# Patient Record
Sex: Female | Born: 1975 | Race: White | Hispanic: No | Marital: Married | Smoking: Never smoker
Health system: Southern US, Community
[De-identification: ages and names within clinical notes are randomized; demographics above are authoritative.]

## PROBLEM LIST (undated history)

## (undated) DIAGNOSIS — J45909 Unspecified asthma, uncomplicated: Secondary | ICD-10-CM

## (undated) HISTORY — PX: CHOLECYSTECTOMY: SHX55

## (undated) HISTORY — PX: TUBAL LIGATION: SHX77

## (undated) HISTORY — PX: ABDOMINAL HYSTERECTOMY: SHX81

---

## 2013-06-17 ENCOUNTER — Emergency Department (HOSPITAL_BASED_OUTPATIENT_CLINIC_OR_DEPARTMENT_OTHER): Payer: BC Managed Care – PPO

## 2013-06-17 ENCOUNTER — Emergency Department (HOSPITAL_BASED_OUTPATIENT_CLINIC_OR_DEPARTMENT_OTHER)
Admission: EM | Admit: 2013-06-17 | Discharge: 2013-06-17 | Disposition: A | Discharge status: Home / Self Care | Payer: BC Managed Care – PPO | Attending: Emergency Medicine | Admitting: Emergency Medicine

## 2013-06-17 ENCOUNTER — Encounter (HOSPITAL_BASED_OUTPATIENT_CLINIC_OR_DEPARTMENT_OTHER): Payer: Self-pay | Admitting: Emergency Medicine

## 2013-06-17 DIAGNOSIS — S93409A Sprain of unspecified ligament of unspecified ankle, initial encounter: Secondary | ICD-10-CM | POA: Diagnosis not present

## 2013-06-17 DIAGNOSIS — Y92009 Unspecified place in unspecified non-institutional (private) residence as the place of occurrence of the external cause: Secondary | ICD-10-CM | POA: Diagnosis not present

## 2013-06-17 DIAGNOSIS — Y9389 Activity, other specified: Secondary | ICD-10-CM | POA: Diagnosis not present

## 2013-06-17 DIAGNOSIS — W010XXA Fall on same level from slipping, tripping and stumbling without subsequent striking against object, initial encounter: Secondary | ICD-10-CM | POA: Insufficient documentation

## 2013-06-17 DIAGNOSIS — J45909 Unspecified asthma, uncomplicated: Secondary | ICD-10-CM | POA: Insufficient documentation

## 2013-06-17 DIAGNOSIS — Z79899 Other long term (current) drug therapy: Secondary | ICD-10-CM | POA: Diagnosis not present

## 2013-06-17 DIAGNOSIS — M25579 Pain in unspecified ankle and joints of unspecified foot: Secondary | ICD-10-CM | POA: Diagnosis present

## 2013-06-17 HISTORY — DX: Unspecified asthma, uncomplicated: J45.909

## 2013-06-17 MED ORDER — HYDROCODONE-ACETAMINOPHEN 5-325 MG PO TABS
1.0000 | ORAL_TABLET | Freq: Once | ORAL | Status: AC
  Administered 2013-06-17: 1 via ORAL
  Filled 2013-06-17: qty 1

## 2013-06-17 MED ORDER — HYDROCODONE-ACETAMINOPHEN 5-325 MG PO TABS: 2.0000 | ORAL_TABLET | ORAL | Status: DC | PRN

## 2013-06-17 NOTE — ED Provider Notes (Signed)
CSN: [id number]     Arrival date & time 06/17/13  1041 History   First MD Initiated Contact with Patient 06/17/13 1148     Chief Complaint  Patient presents with  . Ankle Pain      HPI  Patient inverted her left ankle walking in flip-flops yesterday in her yard. Complains of pain in the left lateral ankle. Has a limp. No pain in the foot. No pain in the proximal lower leg.  Past Medical History  Diagnosis Date  . Asthma    Past Surgical History  Procedure Laterality Date  . Cholecystectomy    . Tubal ligation     No family history on file. History  Substance Use Topics  . Smoking status: Never Smoker   . Smokeless tobacco: Never Used  . Alcohol Use: Yes     Comment: occassionally   OB History   Grav Para Term Preterm Abortions TAB SAB Ect Mult Living                 Review of Systems  Musculoskeletal: Positive for arthralgias, gait problem and joint swelling.      Allergies  Review of patient's allergies indicates no known allergies.  Home Medications   Prior to Admission medications   Medication Sig Start Date End Date Taking? Authorizing Provider  albuterol (PROVENTIL) (2.5 MG/3ML) 0.083% nebulizer solution Take 2.5 mg by nebulization every 6 (six) hours as needed for wheezing or shortness of breath.   Yes Historical Provider, MD  montelukast (SINGULAIR) 10 MG tablet Take 10 mg by mouth as needed.   Yes Historical Provider, MD   BP 131/80  Pulse 86  Temp(Src) 98.6 F (37 C) (Oral)  Resp 18  Ht 5' (1.524 m)  Wt 235 lb (106.595 kg)  BMI 45.90 kg/m2  SpO2 99%  LMP 06/05/2013 Physical Exam  Musculoskeletal:       Feet:  Pain, soft tissue swelling, and tenderness anterior inferior left lateral malleolus. No pain along the proximal fibula. Nontender in the head of the fifth metatarsal or to the foot. Capillary refill. Normal sensation.    ED Course  Procedures (including critical care time) Labs Review Labs Reviewed - No data to display  Imaging  Review Dg Ankle Complete Left  06/17/2013   CLINICAL DATA:  Left ankle pain following injury.  EXAM: LEFT ANKLE COMPLETE - 3+ VIEW  COMPARISON:  None.  FINDINGS: No fracture. Ankle mortise is normally spaced and aligned. There is significant arthropathic change. There are tiny plantar and dorsal calcaneal spurs. Mild diffuse soft tissue swelling is noted.  IMPRESSION: No fracture or dislocation.   Electronically Signed   By: Amie Portland M.D.   On: 06/17/2013 11:39     EKG Interpretation None      MDM   Final diagnoses:  Ankle sprain   X-ray showed a fracture. Placed in an Aircast splint. Instructed to use crutches. Given Vicodin for pain here. Prescription for #10 Vicodin at home. Nonlabored 72 hours. Slowly increase weightbearing as tolerated. Ice elevation.    Rolland Porter, MD 06/17/13 1218

## 2013-06-17 NOTE — Discharge Instructions (Signed)

## 2013-06-17 NOTE — ED Notes (Signed)
Patient states she was getting out of her pool and tripped and fell.  States she has pain in her left lateral ankle.

## 2014-06-18 ENCOUNTER — Encounter (HOSPITAL_BASED_OUTPATIENT_CLINIC_OR_DEPARTMENT_OTHER): Payer: Self-pay

## 2014-06-18 ENCOUNTER — Emergency Department (HOSPITAL_BASED_OUTPATIENT_CLINIC_OR_DEPARTMENT_OTHER)
Admission: EM | Admit: 2014-06-18 | Discharge: 2014-06-19 | Disposition: A | Discharge status: Home / Self Care | Payer: BLUE CROSS/BLUE SHIELD | Attending: Emergency Medicine | Admitting: Emergency Medicine

## 2014-06-18 DIAGNOSIS — Z79899 Other long term (current) drug therapy: Secondary | ICD-10-CM | POA: Diagnosis not present

## 2014-06-18 DIAGNOSIS — J45909 Unspecified asthma, uncomplicated: Secondary | ICD-10-CM | POA: Diagnosis not present

## 2014-06-18 DIAGNOSIS — N764 Abscess of vulva: Secondary | ICD-10-CM | POA: Insufficient documentation

## 2014-06-18 DIAGNOSIS — L02214 Cutaneous abscess of groin: Secondary | ICD-10-CM | POA: Diagnosis present

## 2014-06-18 DIAGNOSIS — L0291 Cutaneous abscess, unspecified: Secondary | ICD-10-CM

## 2014-06-18 DIAGNOSIS — Z9851 Tubal ligation status: Secondary | ICD-10-CM | POA: Diagnosis not present

## 2014-06-18 MED ORDER — LIDOCAINE-EPINEPHRINE 2 %-1:100000 IJ SOLN
20.0000 mL | Freq: Once | INTRAMUSCULAR | Status: DC
  Filled 2014-06-18: qty 1

## 2014-06-18 MED ORDER — SULFAMETHOXAZOLE-TRIMETHOPRIM 800-160 MG PO TABS: 1.0000 | ORAL_TABLET | Freq: Two times a day (BID) | ORAL | Status: DC

## 2014-06-18 NOTE — ED Notes (Signed)
C/o vaginal abscess x 3 days

## 2014-06-18 NOTE — Discharge Instructions (Signed)

## 2014-06-18 NOTE — ED Provider Notes (Signed)
CSN: [id number]     Arrival date & time 06/18/14  2238 History  This chart was scribed for Mirian MoMatthew Helmi Hechavarria, MD by Richarda Overlieichard Holland, ED Scribe. This patient was seen in room MH03/MH03 and the patient's care was started 11:35 PM.   Chief Complaint  Patient presents with  . Abscess   Patient is a 39 y.o. female presenting with abscess. The history is provided by the patient. No language interpreter was used.  Abscess Location:  Ano-genital Ano-genital abscess location:  Groin Abscess quality: draining   Duration:  3 days Progression:  Unchanged Chronicity:  New Associated symptoms: no fever, no nausea and no vomiting    HPI Comments: Rebecca Lynn is a 39 y.o. female who presents to the Emergency Department complaining of an ingrown hair in her pubic area for the last 3 days. Pt reports that she has an abscess and that the area has been draining. She reports certain movements aggravates her pain. She denies fevers, nausea or vomiting.   Past Medical History  Diagnosis Date  . Asthma    Past Surgical History  Procedure Laterality Date  . Cholecystectomy    . Tubal ligation     No family history on file. History  Substance Use Topics  . Smoking status: Never Smoker   . Smokeless tobacco: Never Used  . Alcohol Use: Yes     Comment: occassionally   OB History    No data available     Review of Systems  Constitutional: Negative for fever.  Gastrointestinal: Negative for nausea and vomiting.  Skin:       Abscess   All other systems reviewed and are negative.   Allergies  Review of patient's allergies indicates no known allergies.  Home Medications   Prior to Admission medications   Medication Sig Start Date End Date Taking? Authorizing Provider  albuterol (PROVENTIL) (2.5 MG/3ML) 0.083% nebulizer solution Take 2.5 mg by nebulization every 6 (six) hours as needed for wheezing or shortness of breath.    Historical Provider, MD  montelukast (SINGULAIR) 10 MG tablet Take 10  mg by mouth as needed.    Historical Provider, MD  sulfamethoxazole-trimethoprim (BACTRIM DS,SEPTRA DS) 800-160 MG per tablet Take 1 tablet by mouth 2 (two) times daily. 06/18/14   Mirian MoMatthew Amauri Medellin, MD   BP 161/101 mmHg  Pulse 80  Temp(Src) 98.2 F (36.8 C) (Oral)  Resp 20  Ht 5\' 1"  (1.549 m)  Wt 202 lb 6 oz (91.797 kg)  BMI 38.26 kg/m2  SpO2 99%  LMP 06/13/2014 Physical Exam  Constitutional: She is oriented to person, place, and time. She appears well-developed and well-nourished.  HENT:  Head: Normocephalic and atraumatic.  Right Ear: External ear normal.  Left Ear: External ear normal.  Eyes: Conjunctivae and EOM are normal. Pupils are equal, round, and reactive to light.  Neck: Normal range of motion. Neck supple.  Cardiovascular: Normal rate, regular rhythm, normal heart sounds and intact distal pulses.   Pulmonary/Chest: Effort normal and breath sounds normal.  Abdominal: Soft. Bowel sounds are normal. There is no tenderness.  Genitourinary:  2 cm fluctuance over L labia majora without surrounding erythema  Musculoskeletal: Normal range of motion.  Neurological: She is alert and oriented to person, place, and time.  Skin: Skin is warm and dry.  Vitals reviewed.   ED Course  INCISION AND DRAINAGE Date/Time: 06/18/2014 11:50 PM Performed by: Mirian MoGENTRY, Kal[place] Jaquith Authorized by: Mirian MoGENTRY, Abbigaile Rockman Consent: Verbal consent obtained. Type: abscess Location: L labia majora, superior. Anesthesia: local  infiltration Local anesthetic: lidocaine 1% with epinephrine Scalpel size: 11 Incision type: single straight Complexity: simple Drainage: purulent Drainage amount: scant Patient tolerance: Patient tolerated the procedure well with no immediate complications     DIAGNOSTIC STUDIES: Oxygen Saturation is 99% on RA, normal by my interpretation.    COORDINATION OF CARE: 11:38 PM Discussed treatment plan with pt at bedside and pt agreed to plan.   Labs Review Labs Reviewed   URINALYSIS, ROUTINE W REFLEX MICROSCOPIC (NOT AT [facility]  PREGNANCY, URINE    Imaging Review No results found.   EKG Interpretation None      MDM   Final diagnoses:  Abscess    39 y.o. female without pertinent PMH  presents with abscess of L superior Labia majora.  She had been squeezing and expressing purulence.  Incised with very small amount of purulence with thorough exploration.  No necessity of packing.  Provided prescription for bactrim given recurrent nature of continuing abscess.  DC home in stable condition.    I have reviewed all laboratory and imaging studies if ordered as above  1. Abscess           Mirian Mo, MD 06/18/14 2351

## 2014-11-09 ENCOUNTER — Emergency Department (HOSPITAL_BASED_OUTPATIENT_CLINIC_OR_DEPARTMENT_OTHER)
Admission: EM | Admit: 2014-11-09 | Discharge: 2014-11-09 | Disposition: A | Discharge status: Home / Self Care | Payer: BLUE CROSS/BLUE SHIELD | Attending: Emergency Medicine | Admitting: Emergency Medicine

## 2014-11-09 ENCOUNTER — Encounter (HOSPITAL_BASED_OUTPATIENT_CLINIC_OR_DEPARTMENT_OTHER): Payer: Self-pay | Admitting: Emergency Medicine

## 2014-11-09 DIAGNOSIS — N751 Abscess of Bartholin's gland: Secondary | ICD-10-CM | POA: Insufficient documentation

## 2014-11-09 DIAGNOSIS — Z79899 Other long term (current) drug therapy: Secondary | ICD-10-CM | POA: Diagnosis not present

## 2014-11-09 DIAGNOSIS — J45909 Unspecified asthma, uncomplicated: Secondary | ICD-10-CM | POA: Diagnosis not present

## 2014-11-09 DIAGNOSIS — Z792 Long term (current) use of antibiotics: Secondary | ICD-10-CM | POA: Diagnosis not present

## 2014-11-09 MED ORDER — LIDOCAINE-EPINEPHRINE-TETRACAINE (LET) SOLUTION
3.0000 mL | Freq: Once | NASAL | Status: AC
  Administered 2014-11-09: 3 mL via TOPICAL
  Filled 2014-11-09: qty 3

## 2014-11-09 MED ORDER — LIDOCAINE HCL 2 % IJ SOLN
5.0000 mL | Freq: Once | INTRAMUSCULAR | Status: AC
  Administered 2014-11-09: 100 mg via INTRADERMAL
  Filled 2014-11-09: qty 20

## 2014-11-09 NOTE — ED Notes (Signed)
Pt in c/o ingrown hair/abscess to L groin area.

## 2014-11-09 NOTE — ED Notes (Signed)
Bed setup completed with stirrups only.

## 2014-11-09 NOTE — ED Notes (Signed)
Joni ReiningNicole, EDPA at bedside with chaperone for I&D

## 2014-11-09 NOTE — Discharge Instructions (Signed)
Please follow with your primary care doctor in the next 2 days for a check-up. They must obtain records for further management.   Do not hesitate to return to the Emergency Department for any new, worsening or concerning symptoms.    Bartholin Cyst or Abscess A Bartholin cyst is a fluid-filled sac that forms on a Bartholin gland. Bartholin glands are small glands that are located within the folds of skin (labia) along the sides of the lower opening of the vagina. These glands produce a fluid to moisten the outside of the vagina during sexual intercourse. A Bartholin cyst causes a bulge on the side of the vagina. A cyst that is not large or infected may not cause symptoms or problems. However, if the fluid within the cyst becomes infected, the cyst can turn into an abscess. An abscess may cause discomfort or pain. CAUSES A Bartholin cyst may develop when the duct of the gland becomes blocked. In many cases, the cause of this is not known. Various kinds of bacteria can cause the cyst to become infected and develop into an abscess. RISK FACTORS You may be at an increased risk of developing a Bartholin cyst or abscess if:  You are a woman of reproductive age.  You have a history of previous Bartholin cysts or abscesses.  You have diabetes.  You have a sexually transmitted disease (STD). SIGNS AND SYMPTOMS The severity of symptoms varies depending on the size of the cyst and whether it is infected. Symptoms may include:  A bulge or swelling near the lower opening of your vagina.  Discomfort or pain.  Redness.  Pain during sexual intercourse.  Pain when walking.  Fluid draining from the area. DIAGNOSIS Your health care provider may make a diagnosis based on your symptoms and a physical exam. He or she will look for swelling in your vaginal area. Blood tests may be done to check for infections. A sample of fluid from the cyst or abscess may also be taken to be tested in a  lab. TREATMENT Small cysts that are not infected may not require any treatment. These often go away on their own. Yourhealth care provider will recommend hot baths and the use of warm compresses. These may also be part of the treatment for an abscess. Treatment options for a large cyst or abscess may include:   Antibiotic medicine.  A surgical procedure to drain the abscess. One of the following procedures may be done:  Incision and drainage. An incision is made in the cyst or abscess so that the fluid drains out. A catheter may be placed inside the cyst so that it does not close and fill up with fluid again. The catheter will be removed after you have a follow-up visit with a specialist (gynecologist).  Marsupialization. The cyst or abscess is opened and kept open by stitching the edges of the skin to the walls of the cyst or abscess. This allows it to continue to drain and not fill up with fluid again. If you have cysts or abscesses that keep returning and have required incision and drainage multiple times, your health care provider may talk to you about surgery to remove the Bartholin gland. HOME CARE INSTRUCTIONS  Take medicines only as directed by your health care provider.  If you were prescribed an antibiotic medicine, finish it all even if you start to feel better.  Apply warm, wet compresses to the area or take warm, shallow baths that cover your pelvic region (sitz baths)  several times a day or as directed by your health care provider.  Do not squeeze the cyst or apply heavy pressure to it.  Do not have sexual intercourse until the cyst has gone away.  If your cyst or abscess was opened, a small piece of gauze or a drain may have been placed in the area to allow drainage. Do not remove the gauze or the drain until directed by your health care provider.  Wear feminine pads--not tampons--as needed for any drainage or bleeding.  Keep all follow-up visits as directed by your  health care provider. This is important. PREVENTION Take these steps to help prevent a Bartholin cyst from returning:  Practice good hygiene.   Clean your vaginal area with mild soap and a soft cloth when you bathe.  Practice safe sex to prevent STDs. SEEK MEDICAL CARE IF:  You have increased pain, swelling, or redness in the area of the cyst.  Puslike drainage is coming from the cyst.  You have a fever.   This information is not intended to replace advice given to you by your health care provider. Make sure you discuss any questions you have with your health care provider.   Document Released: 01/10/2005 Document Revised: 01/31/2014 Document Reviewed: 08/26/2013 Elsevier Interactive Patient Education 2016 Else[employer]  Incision and Drainage Incision and drainage is a procedure in which a sac-like structure (cystic structure) is opened and drained. The area to be drained usually contains material such as pus, fluid, or blood.  LET YOUR CAREGIVER KNOW ABOUT:   Allergies to medicine.  Medicines taken, including vitamins, herbs, eyedrops, over-the-counter medicines, and creams.  Use of steroids (by mouth or creams).  Previous problems with anesthetics or numbing medicines.  History of bleeding problems or blood clots.  Previous surgery.  Other health problems, including diabetes and kidney problems.  Possibility of pregnancy, if this applies. RISKS AND COMPLICATIONS  Pain.  Bleeding.  Scarring.  Infection. BEFORE THE PROCEDURE  You may need to have an ultrasound or other imaging tests to see how large or deep your cystic structure is. Blood tests may also be used to determine if you have an infection or how severe the infection is. You may need to have a tetanus shot. PROCEDURE  The affected area is cleaned with a cleaning fluid. The cyst area will then be numbed with a medicine (local anesthetic). A small incision will be made in the cystic structure. A syringe  or catheter may be used to drain the contents of the cystic structure, or the contents may be squeezed out. The area will then be flushed with a cleansing solution. After cleansing the area, it is often gently packed with a gauze or another wound dressing. Once it is packed, it will be covered with gauze and tape or some other type of wound dressing. AFTER THE PROCEDURE   Often, you will be allowed to go home right after the procedure.  You may be given antibiotic medicine to prevent or heal an infection.  If the area was packed with gauze or some other wound dressing, you will likely need to come back in 1 to 2 days to get it removed.  The area should heal in about 14 days.   This information is not intended to replace advice given to you by your health care provider. Make sure you discuss any questions you have with your health care provider.   Document Released: 07/06/2000 Document Revised: 07/12/2011 Document Reviewed: 03/07/2011 Elsevier Interactive Patient  Education ©2016 Elsevier Inc. ° °

## 2014-11-09 NOTE — ED Provider Notes (Signed)
CSN: [id number]     Arrival date & time 11/09/14  1851 History   First MD Initiated Contact with Patient 11/09/14 1907     Chief Complaint  Patient presents with  . Abscess     (Consider location/radiation/quality/duration/timing/severity/associated sxs/prior Treatment) HPI  Blood pressure 150/87, pulse 98, temperature 98.6 F (37 C), temperature source Oral, resp. rate 18, height 5\' 1"  (1.549 m), weight 200 lb (90.719 kg), last menstrual period 11/03/2014, SpO2 100 %.  Rebecca Lynn is a 39 y.o. female complaining of abscess to left lobe be a worsening over the course of 2 days. Pain is severe, states she cannot get appears taken over-the-counter pain medication with little relief. Patient had similar episode approximately one year ago. She was not evaluated by her OB/GYN at that time. Patient denies fever, chills, nausea, vomiting, abnormal vaginal discharge.  Past Medical History  Diagnosis Date  . Asthma    Past Surgical History  Procedure Laterality Date  . Cholecystectomy    . Tubal ligation     History reviewed. No pertinent family history. Social History  Substance Use Topics  . Smoking status: Never Smoker   . Smokeless tobacco: Never Used  . Alcohol Use: Yes     Comment: occassionally   OB History    No data available     Review of Systems  10 systems reviewed and found to be negative, except as noted in the HPI.   Allergies  Review of patient's allergies indicates no known allergies.  Home Medications   Prior to Admission medications   Medication Sig Start Date End Date Taking? Authorizing Provider  albuterol (PROVENTIL) (2.5 MG/3ML) 0.083% nebulizer solution Take 2.5 mg by nebulization every 6 (six) hours as needed for wheezing or shortness of breath.    Historical Provider, MD  montelukast (SINGULAIR) 10 MG tablet Take 10 mg by mouth as needed.    Historical Provider, MD  sulfamethoxazole-trimethoprim (BACTRIM DS,SEPTRA DS) 800-160 MG per tablet  Take 1 tablet by mouth 2 (two) times daily. 06/18/14   Mirian MoMatthew Gentry, MD   BP 150/87 mmHg  Pulse 98  Temp(Src) 98.6 F (37 C) (Oral)  Resp 18  Ht 5\' 1"  (1.549 m)  Wt 200 lb (90.719 kg)  BMI 37.81 kg/m2  SpO2 100%  LMP 11/03/2014 Physical Exam  Constitutional: She is oriented to person, place, and time. She appears well-developed and well-nourished. No distress.  HENT:  Head: Normocephalic.  Eyes: Conjunctivae and EOM are normal.  Cardiovascular: Normal rate.   Pulmonary/Chest: Effort normal. No stridor.  Musculoskeletal: Normal range of motion.  Neurological: She is alert and oriented to person, place, and time.  Skin:  Left sided bartholins abscess with no active drainage, no surrounding cellulitis  Psychiatric: She has a normal mood and affect.  Nursing note and vitals reviewed.   ED Course  .Marland Kitchen.Incision and Drainage Date/Time: 11/10/2014 12:18 AM Performed by: Wynetta EmeryPISCIOTTA, Mykale Gandolfo Authorized by: Wynetta EmeryPISCIOTTA, Araiya Tilmon Consent: Verbal consent obtained. Consent given by: patient Time out: Immediately prior to procedure a "time out" was called to verify the correct patient, procedure, equipment, support staff and site/side marked as required. Type: abscess Body area: anogenital Location details: Bartholin's gland Anesthesia: local infiltration Local anesthetic: LET (lido,epi,tetracaine) and lidocaine 2% with epinephrine Anesthetic total: 2 ml Patient sedated: no Scalpel size: 11 Incision type: single straight Incision depth: dermal Complexity: complex Drainage: purulent Drainage amount: copious Wound treatment: wound left open Packing material: none Patient tolerance: Patient tolerated the procedure well with no immediate complications   (  including critical care time) Labs Review Labs Reviewed - No data to display  Imaging Review No results found. I have personally reviewed and evaluated these images and lab results as part of my medical decision-making.   EKG  Interpretation None      MDM   Final diagnoses:  Bartholin's gland abscess    Filed Vitals:   11/09/14 1858 11/09/14 2137  BP: 150/87 125/90  Pulse: 98 96  Temp: 98.6 F (37 C)   TempSrc: Oral   Resp: 18 18  Height:  (1.549 m)   Weight: 200 lb (90.719 kg)   SpO2: 100% 98%    Medications  lidocaine-EPINEPHrine-tetracaine (LET) solution (3 mLs Topical Given 11/09/14 2039)  lidocaine (XYLOCAINE) 2 % (with pres) injection 100 mg (100 mg Intradermal Given by Other 11/09/14 2041)    Rebecca Lynn is 39 y.o. female presenting with left-sided Bartholin's gland abscess, no signs of systemic infection. This is the second recurrence. She's not seen her OB/GYN on this. I and D is performed and patient is counseled on sitz bath and wound care and return precautions.  Evaluation does not show pathology that would require ongoing emergent intervention or inpatient treatment. Pt is hemodynamically stable and mentating appropriately. Discussed findings and plan with patient/guardian, who agrees with care plan. All questions answered. Return precautions discussed and outpatient follow up given.      Wynetta Emery, PA-C 11/10/14 0020  Glynn Octave, MD 11/10/14 Moses Manners

## 2014-11-09 NOTE — ED Notes (Signed)
Pt with small amount of bloody drainage from abscess incision. Menstrual Pad given to patient at d/c home.

## 2018-03-04 ENCOUNTER — Encounter (HOSPITAL_BASED_OUTPATIENT_CLINIC_OR_DEPARTMENT_OTHER): Payer: Self-pay | Admitting: Emergency Medicine

## 2018-03-04 ENCOUNTER — Emergency Department (HOSPITAL_BASED_OUTPATIENT_CLINIC_OR_DEPARTMENT_OTHER): Payer: BLUE CROSS/BLUE SHIELD

## 2018-03-04 ENCOUNTER — Other Ambulatory Visit: Payer: Self-pay

## 2018-03-04 ENCOUNTER — Emergency Department (HOSPITAL_BASED_OUTPATIENT_CLINIC_OR_DEPARTMENT_OTHER)
Admission: EM | Admit: 2018-03-04 | Discharge: 2018-03-04 | Disposition: A | Discharge status: Home / Self Care | Payer: BLUE CROSS/BLUE SHIELD | Attending: Emergency Medicine | Admitting: Emergency Medicine

## 2018-03-04 DIAGNOSIS — Z9049 Acquired absence of other specified parts of digestive tract: Secondary | ICD-10-CM | POA: Insufficient documentation

## 2018-03-04 DIAGNOSIS — R059 Cough, unspecified: Secondary | ICD-10-CM

## 2018-03-04 DIAGNOSIS — Z79899 Other long term (current) drug therapy: Secondary | ICD-10-CM | POA: Insufficient documentation

## 2018-03-04 DIAGNOSIS — J4541 Moderate persistent asthma with (acute) exacerbation: Secondary | ICD-10-CM | POA: Insufficient documentation

## 2018-03-04 DIAGNOSIS — R05 Cough: Secondary | ICD-10-CM | POA: Insufficient documentation

## 2018-03-04 MED ORDER — BENZONATATE 100 MG PO CAPS: 100.0000 mg | ORAL_CAPSULE | Freq: Three times a day (TID) | ORAL | 0 refills | Status: AC

## 2018-03-04 MED ORDER — AZITHROMYCIN 250 MG PO TABS: 250.0000 mg | ORAL_TABLET | Freq: Every day | ORAL | 0 refills | Status: DC

## 2018-03-04 MED ORDER — PREDNISONE 50 MG PO TABS
60.0000 mg | ORAL_TABLET | Freq: Once | ORAL | Status: AC
  Administered 2018-03-04: 60 mg via ORAL
  Filled 2018-03-04: qty 1

## 2018-03-04 MED ORDER — PREDNISONE 10 MG (21) PO TBPK: ORAL_TABLET | ORAL | 0 refills | Status: DC

## 2018-03-04 MED ORDER — IPRATROPIUM-ALBUTEROL 0.5-2.5 (3) MG/3ML IN SOLN
3.0000 mL | Freq: Once | RESPIRATORY_TRACT | Status: AC
  Administered 2018-03-04: 3 mL via RESPIRATORY_TRACT
  Filled 2018-03-04: qty 3

## 2018-03-04 NOTE — Discharge Instructions (Addendum)
°  Take the prednisone, as prescribed, until finished.  Take the azithromycin, as prescribed, until finished.  It is very important that you follow-up with a primary care provider on this matter.  You will likely need additional testing to properly assess your asthma.  Return to the ED for worsening symptoms.

## 2018-03-04 NOTE — ED Notes (Signed)
Pt left before RN saw order to check VS. Pt ambulatory and in no distress.

## 2018-03-04 NOTE — ED Triage Notes (Addendum)
Cough and SOB for several weeks. Has been treated with nebs and steroids with no relief.

## 2018-03-04 NOTE — ED Provider Notes (Signed)
[facility] EMERGENCY DEPARTMENT Provider Note   CSN: [id number] Arrival date & time: 03/04/18  1122     History   Chief Complaint Chief Complaint  Patient presents with  . Cough    HPI Kihanna Bruckner is a 43 y.o. female.  HPI   Layona Rossin is a 43 y.o. female, with a history of asthma, presenting to the ED with cough and wheezing for the last several weeks beginning in December 2019. Occasional shortness of breath.  Seemed to coincide with the weather changes. Has been using her albuterol inhaler almost daily and intermittently used her albuterol nebulizer.   Has been to Urgent care/ED twice since onset and has received steroids and cough medicine. No recent ABX. No new medications.  Denies tobacco or illicit drug use.   Denies CP, syncope, lower extremity swelling/pain, N/V/D, fever, or any other complaints.       Past Medical History:  Diagnosis Date  . Asthma     There are no active problems to display for this patient.   Past Surgical History:  Procedure Laterality Date  . ABDOMINAL HYSTERECTOMY    . CHOLECYSTECTOMY    . TUBAL LIGATION       OB History   No obstetric history on file.      Home Medications    Prior to Admission medications   Medication Sig Start Date End Date Taking? Authorizing Provider  cetirizine (ZYRTEC) 10 MG tablet Take 10 mg by mouth daily.   Yes [provider]  albuterol (PROVENTIL) (2.5 MG/3ML) 0.083% nebulizer solution Take 2.5 mg by nebulization every 6 (six) hours as needed for wheezing or shortness of breath.    [provider]  azithromycin (ZITHROMAX) 250 MG tablet Take 1 tablet (250 mg total) by mouth daily. Take first 2 tablets together, then 1 every day until finished. 03/04/18   Bryse Blanchette C, PA-C  benzonatate (TESSALON) 100 MG capsule Take 1 capsule (100 mg total) by mouth every 8 (eight) hours. 03/04/18   Amil Moseman C, PA-C  predniSONE (STERAPRED UNI-PAK 21 TAB) 10 MG (21) TBPK tablet  Take 6 tabs by mouth daily  for 2 days, then 5 tabs for 2 days, then 4 tabs for 2 days, then 3 tabs for 2 days, 2 tabs for 2 days, then 1 tab by mouth daily for 2 days 03/04/18   Anselm Pancoast, PA-C    Family History No family history on file.  Social History Social History   Tobacco Use  . Smoking status: Never Smoker  . Smokeless tobacco: Never Used  Substance Use Topics  . Alcohol use: Yes    Comment: occassionally  . Drug use: No     Allergies   Patient has no known allergies.   Review of Systems Review of Systems  Constitutional: Negative for diaphoresis and fever.  HENT: Positive for congestion.   Respiratory: Positive for cough, shortness of breath and wheezing.   Cardiovascular: Negative for chest pain, palpitations and leg swelling.  Gastrointestinal: Negative for abdominal pain, diarrhea, nausea and vomiting.  Musculoskeletal: Negative for back pain.  Neurological: Negative for syncope.  All other systems reviewed and are negative.    Physical Exam Updated Vital Signs BP (!) 139/101 (BP Location: Left Arm)   Pulse 94   Temp 99.3 F (37.4 C) (Oral)   Resp 18   Ht 5' (1.524 m)   Wt 99.8 kg   LMP 11/03/2014   SpO2 100%   BMI 42.97 kg/m  Physical Exam Vitals signs and nursing note reviewed.  Constitutional:      General: She is not in acute distress.    Appearance: She is well-developed. She is not diaphoretic.  HENT:     Head: Normocephalic and atraumatic.     Nose: Nose normal.     Mouth/Throat:     Mouth: Mucous membranes are moist.     Pharynx: Oropharynx is clear.  Eyes:     Conjunctiva/sclera: Conjunctivae normal.  Neck:     Musculoskeletal: Neck supple.  Cardiovascular:     Rate and Rhythm: Normal rate and regular rhythm.     Pulses: Normal pulses.     Heart sounds: Normal heart sounds.  Pulmonary:     Effort: Pulmonary effort is normal. No respiratory distress.     Breath sounds: Decreased breath sounds and wheezing present.    Abdominal:     Palpations: Abdomen is soft.     Tenderness: There is no abdominal tenderness. There is no guarding.  Musculoskeletal:     Right lower leg: No edema.     Left lower leg: No edema.  Lymphadenopathy:     Cervical: No cervical adenopathy.  Skin:    General: Skin is warm and dry.  Neurological:     Mental Status: She is alert.  Psychiatric:        Mood and Affect: Mood and affect normal.        Speech: Speech normal.        Behavior: Behavior normal.      ED Treatments / Results  Labs (all labs ordered are listed, but only abnormal results are displayed) Labs Reviewed - No data to display  EKG None  Radiology Dg Chest 2 View  Result Date: 03/04/2018 CLINICAL DATA:  Cough and shortness of breath for several weeks skip EXAM: CHEST - 2 VIEW COMPARISON:  None FINDINGS: Airway thickening is present, suggesting bronchitis or reactive airways disease. Cardiac and mediastinal margins appear normal. No discrete airspace opacity. Mild thoracic spondylosis. IMPRESSION: Airway thickening is present, suggesting bronchitis or reactive airways disease. Electronically Signed   By: Gaylyn Rong M.D.   On: 03/04/2018 13:47    Procedures Procedures (including critical care time)  Medications Ordered in ED Medications  ipratropium-albuterol (DUONEB) 0.5-2.5 (3) MG/3ML nebulizer solution 3 mL (3 mLs Nebulization Given 03/04/18 1317)  predniSONE (DELTASONE) tablet 60 mg (60 mg Oral Given 03/04/18 1317)     Initial Impression / Assessment and Plan / ED Course  I have reviewed the triage vital signs and the nursing notes.  Pertinent labs & imaging results that were available during my care of the patient were reviewed by me and considered in my medical decision making (see chart for details).  Clinical Course as of Mar 05 1923  Wynelle Link Mar 04, 2018  1406 Discussed x-ray findings with the patient.  She states she feels much better.  Lung sounds are much less diminished and  wheezing is minimal.   [SJ]    Clinical Course User Index [SJ] Barbaraann Avans C, PA-C   Patient presents with persistent cough and wheezing.  Intermittent shortness of breath.  No increased work of breathing or signs of distress here in the ED.  Bronchitis versus reactive airway disease on chest x-ray.  Due to duration of symptoms, we will try a course of antibiotics, however, it was strongly recommended to the patient that she follow-up with a PCP. Return precautions discussed.  Patient voices understanding of these instructions, accepts the  plan, and is comfortable with discharge.   Final Clinical Impressions(s) / ED Diagnoses   Final diagnoses:  Cough  Moderate persistent asthma with exacerbation    ED Discharge Orders         Ordered    predniSONE (STERAPRED UNI-PAK 21 TAB) 10 MG (21) TBPK tablet     03/04/18 1407    azithromycin (ZITHROMAX) 250 MG tablet  Daily     03/04/18 1407    benzonatate (TESSALON) 100 MG capsule  Every 8 hours     03/04/18 1407           Anselm PancoastJoy, Trinita Devlin C, PA-C 03/04/18 1932    Derwood KaplanNanavati, Ankit, MD 03/05/18 0725

## 2019-01-23 ENCOUNTER — Other Ambulatory Visit: Payer: Self-pay

## 2019-01-23 ENCOUNTER — Ambulatory Visit
Admission: RE | Admit: 2019-01-23 | Discharge: 2019-01-23 | Disposition: A | Discharge status: Home / Self Care | Payer: PRIVATE HEALTH INSURANCE | Source: Ambulatory Visit | Attending: Emergency Medicine | Admitting: Emergency Medicine

## 2019-01-23 DIAGNOSIS — J45901 Unspecified asthma with (acute) exacerbation: Secondary | ICD-10-CM | POA: Insufficient documentation

## 2019-01-23 MED ORDER — PREDNISONE 10 MG (21) PO TBPK: ORAL_TABLET | Freq: Every day | ORAL | 0 refills | Status: AC

## 2019-01-23 MED ORDER — ALBUTEROL SULFATE HFA 108 (90 BASE) MCG/ACT IN AERS: 2.0000 | INHALATION_SPRAY | RESPIRATORY_TRACT | 0 refills | Status: AC | PRN

## 2019-01-23 MED ORDER — AZITHROMYCIN 250 MG PO TABS: 250.0000 mg | ORAL_TABLET | Freq: Every day | ORAL | 0 refills | Status: AC

## 2019-01-23 NOTE — ED Provider Notes (Signed)
[facility] EMERGENCY DEPT VIDEO VISIT Provider Note   CSN: [id number] Arrival date & time: 01/23/19  1053     History Chief Complaint  Patient presents with  . Asthma   Connected with Ms. Chassity Helper via virtual video visit at 12:07 PM, patient confirmed identity and expressed understanding of limitations of video visit.  Patient/guardian understands the limitations in scope of a virtual visit: Yes  Patient/guardian provides consent to proceed with a virtual visit: Yes   CC: Asthma exacerbation  HPI: Patient is a 43 y/o female requesting emergency evaluation for their complaint of asthma/wheezing.  Patient states that she has had wheezing with some mild chest tightness and cough.  States symptoms initially began on 12/14, and she was seen in the [place] ED on 12/16, had normal chest x-ray, and was treated with a 5-day course of prednisone in addition to her inhaler and nebulizers.  She states that she also found out on 12/17 that she was Covid positive, but aside with symptoms that she associates with her typical asthma exacerbation she did not have any other symptoms including fever, body aches, chills, vomiting or diarrhea.  She states that a few days after she completed her 5-day course of prednisone her wheezing and cough seem to return.  States that this is worse at night and she frequently has coughing that keeps her up from sleep.  She has been using her nebulizer, and states this gives her relief for a few hours but then symptoms returned she has had wheezing with intermittent chest tightness but no persistent chest pain.  Cough is productive intermittently of clear sputum.  She has not had any fevers or chills.  No rhinorrhea or sore throat.  No persistent shortness of breath.  She denies any abdominal pain, vomiting or diarrhea.  States that often times for her asthma exacerbation to completely resolve she has to be placed on a longer steroid taper and she states that she usually  receives a Z-Pak as well to help resolve her symptoms.  States that she has an upcoming appointment with her PCP at the beginning of January.  States this feels exactly like her previous asthma exacerbations.  Associated symptoms include: cough, chest tightness Prior history of similar complaints: Yes     Past Medical History:  Diagnosis Date  . Asthma     There are no problems to display for this patient.   Past Surgical History:  Procedure Laterality Date  . ABDOMINAL HYSTERECTOMY    . CHOLECYSTECTOMY    . TUBAL LIGATION       OB History   No obstetric history on file.     No family history on file.  Social History   Tobacco Use  . Smoking status: Never Smoker  . Smokeless tobacco: Never Used  Substance Use Topics  . Alcohol use: Yes    Comment: occassionally  . Drug use: No    Home Medications Prior to Admission medications   Medication Sig Start Date End Date Taking? Authorizing Provider  albuterol (PROVENTIL) (2.5 MG/3ML) 0.083% nebulizer solution Take 2.5 mg by nebulization every 6 (six) hours as needed for wheezing or shortness of breath.    [provider]  azithromycin (ZITHROMAX) 250 MG tablet Take 1 tablet (250 mg total) by mouth daily. Take first 2 tablets together, then 1 every day until finished. 03/04/18   Joy, Shawn C, PA-C  benzonatate (TESSALON) 100 MG capsule Take 1 capsule (100 mg total) by mouth every 8 (eight) hours.  03/04/18   Joy, Shawn C, PA-C  cetirizine (ZYRTEC) 10 MG tablet Take 10 mg by mouth daily.    [provider]  predniSONE (STERAPRED UNI-PAK 21 TAB) 10 MG (21) TBPK tablet Take 6 tabs by mouth daily  for 2 days, then 5 tabs for 2 days, then 4 tabs for 2 days, then 3 tabs for 2 days, 2 tabs for 2 days, then 1 tab by mouth daily for 2 days 03/04/18   Harolyn Rutherford C, PA-C    Allergies    Patient has no known allergies.  Review of Systems   Review of Systems  Constitutional: Negative for chills and fever.  HENT: Negative  for congestion, rhinorrhea and sore throat.   Respiratory: Positive for cough, chest tightness and wheezing. Negative for shortness of breath.   Cardiovascular: Negative for chest pain and leg swelling.  Gastrointestinal: Negative for abdominal pain, diarrhea, nausea and vomiting.  Musculoskeletal: Negative for myalgias.  Neurological: Negative for headaches.    Physical Exam Vital Signs Provided by patient/family: none  Physical Exam General appearance: well developed, NAD, speech is clear and goal oriented Head: normocephalic Neck: supple  Eye: Conjunctiva normal, no scleral icterus Lungs: respirations are even and unlabored, no increased work of breathing, no stridor, patient able to speak in full sentences MSK: Moving extremities without difficulty Skin: No grossly visible lesions or rash Neuro: Alert, following commands, GCS 15.  Psych: normal mood  ED Results / Procedures / Treatments   Labs (all labs ordered are listed, but only abnormal results are displayed) Labs Reviewed - No data to display  EKG None  Radiology No results found.  Procedures Procedures (including critical care time)  Medications Ordered in ED Medications - No data to display  ED Course  I have reviewed the triage vital signs and the nursing notes.  Pertinent labs & imaging results that were available during my care of the patient were reviewed by me and considered in my medical decision making (see chart for details).    MDM Rules/Calculators/A&P                      Patient assessed via emergency department virtual visit for Asthma .  Previous lab results and imaging was reviewed for the encounter: Yes, had recent chest x-ray on 12/16 that was clear, did have positive Covid test but aside from cough and wheezing did not experience other symptoms  Differential diagnosis includes: Asthma exacerbation, bronchitis, pneumonia, viral URI, Covid  Assessment/Clinical impression: Asthma  exacerbation, seems to be mild patient has normal work of breathing and is able to speak in full sentences during video visit with no audible wheezing.  Feels similar to previous asthma exacerbations.  Was diagnosed with Covid when she was seen on 12/16, but is outside of her 14-day quarantine window and has had not had fevers, chest pain, shortness of breath to suggest worsening Covid respiratory disease.  States she had initial improvement after treatment with short burst of prednisone but a few days later symptoms return, states that she usually requires a longer prednisone taper to completely resolve her asthma exacerbations, states that she is usually placed on a Z-Pak as well.   Plan: Will treat with albuterol inhaler, steroid taper and Z-Pak, patient states this is what typically helps with her asthma. Also recommend OTC mucinex, and close follow up if symptoms not improving.  Medications prescribed or recommended: Adderall inhaler, prednisone taper, azithromycin, mucinex Disposition: follow up with ER, urgent  care or PCP within 1 to 2 days if symptoms or not improving, present to the ED immediately with any worsening in breathing or wheezing.   This encounter was completed through [facility] utilizing the virtual health platform at the request of, and with the consent of the patient. The encounter took place during the Covid-19 national pandemic when limiting virus exposure to the community at acute care setting is considered an important step in reducing transmission of the disease. The technology provided at the hospital for this encounter are in compliance with HIPPA laws and include a desktop with dual screen monitor, camera and audio head-set.   Total time used for this encounter: 10 minutes    Final Clinical Impression(s) / ED Diagnoses Final diagnoses:  Exacerbation of asthma, unspecified asthma severity, unspecified whether persistent    Rx / DC Orders ED Discharge  Orders         Ordered    albuterol (VENTOLIN HFA) 108 (90 Base) MCG/ACT inhaler  Every 4 hours PRN     01/23/19 1219    predniSONE (STERAPRED UNI-PAK 21 TAB) 10 MG (21) TBPK tablet  Daily     01/23/19 1219    azithromycin (ZITHROMAX) 250 MG tablet  Daily     01/23/19 1219           Dartha LodgeFord, Ishmail Mcmanamon N, [place]PA-C 01/23/19 1236    Benjiman CorePickering, Nathan, MD 01/23/19 1537

## 2019-01-23 NOTE — Discharge Instructions (Addendum)
Your albuterol inhaler or nebulizer every 4 hours for the next 24 hours and then as needed.  Take Z-Pak and steroid taper as directed.  If your symptoms are not improving over the next 2 days need to be seen at an emergency department or urgent care for further evaluation of your symptoms.

## 2020-09-21 IMAGING — DX DG CHEST 2V
2 series · 2 of 2 positions shown · non-contrast
Comparison: None

CLINICAL DATA: Cough and shortness of breath for several weeks skip

EXAM:
CHEST - 2 VIEW

[chest pa]
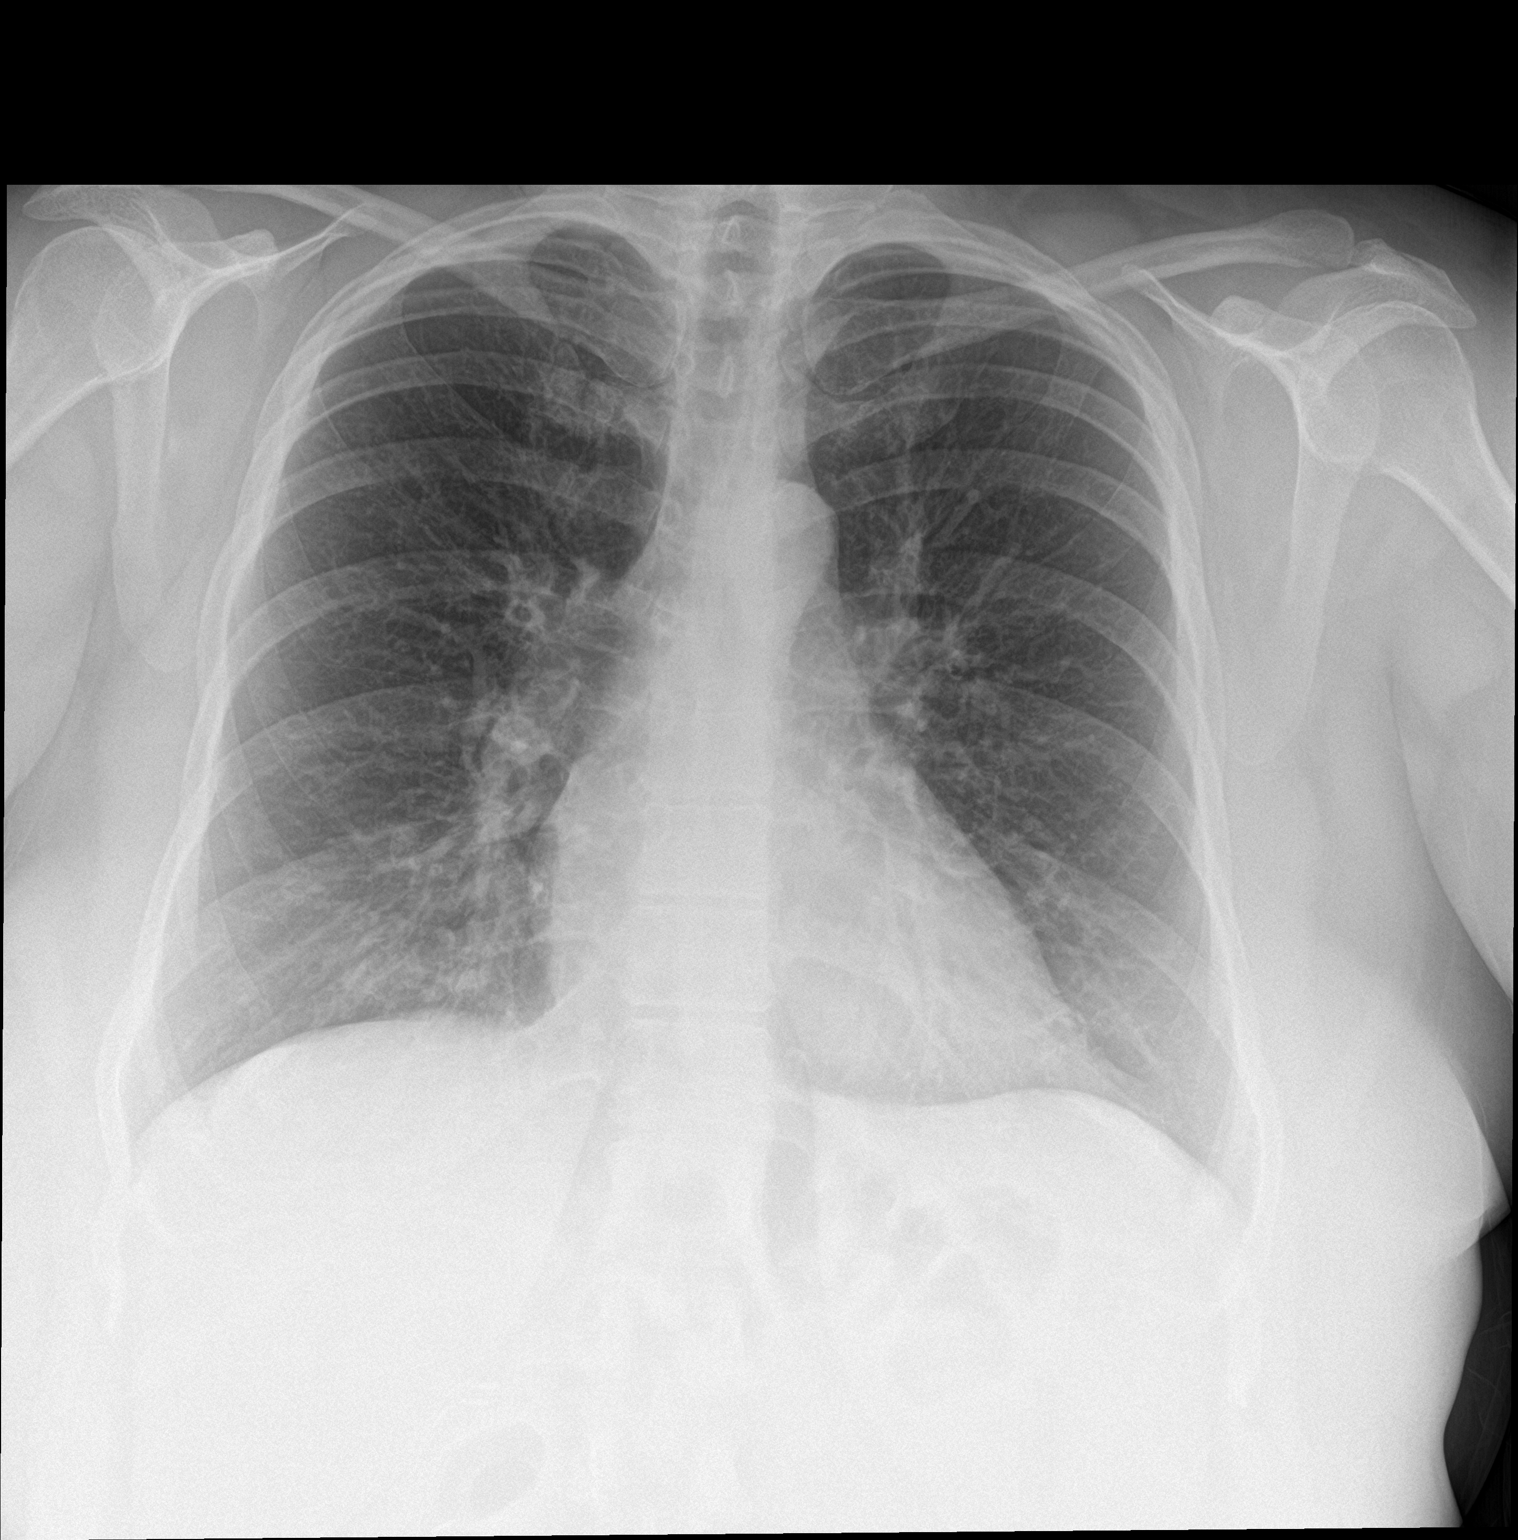

[chest lat]
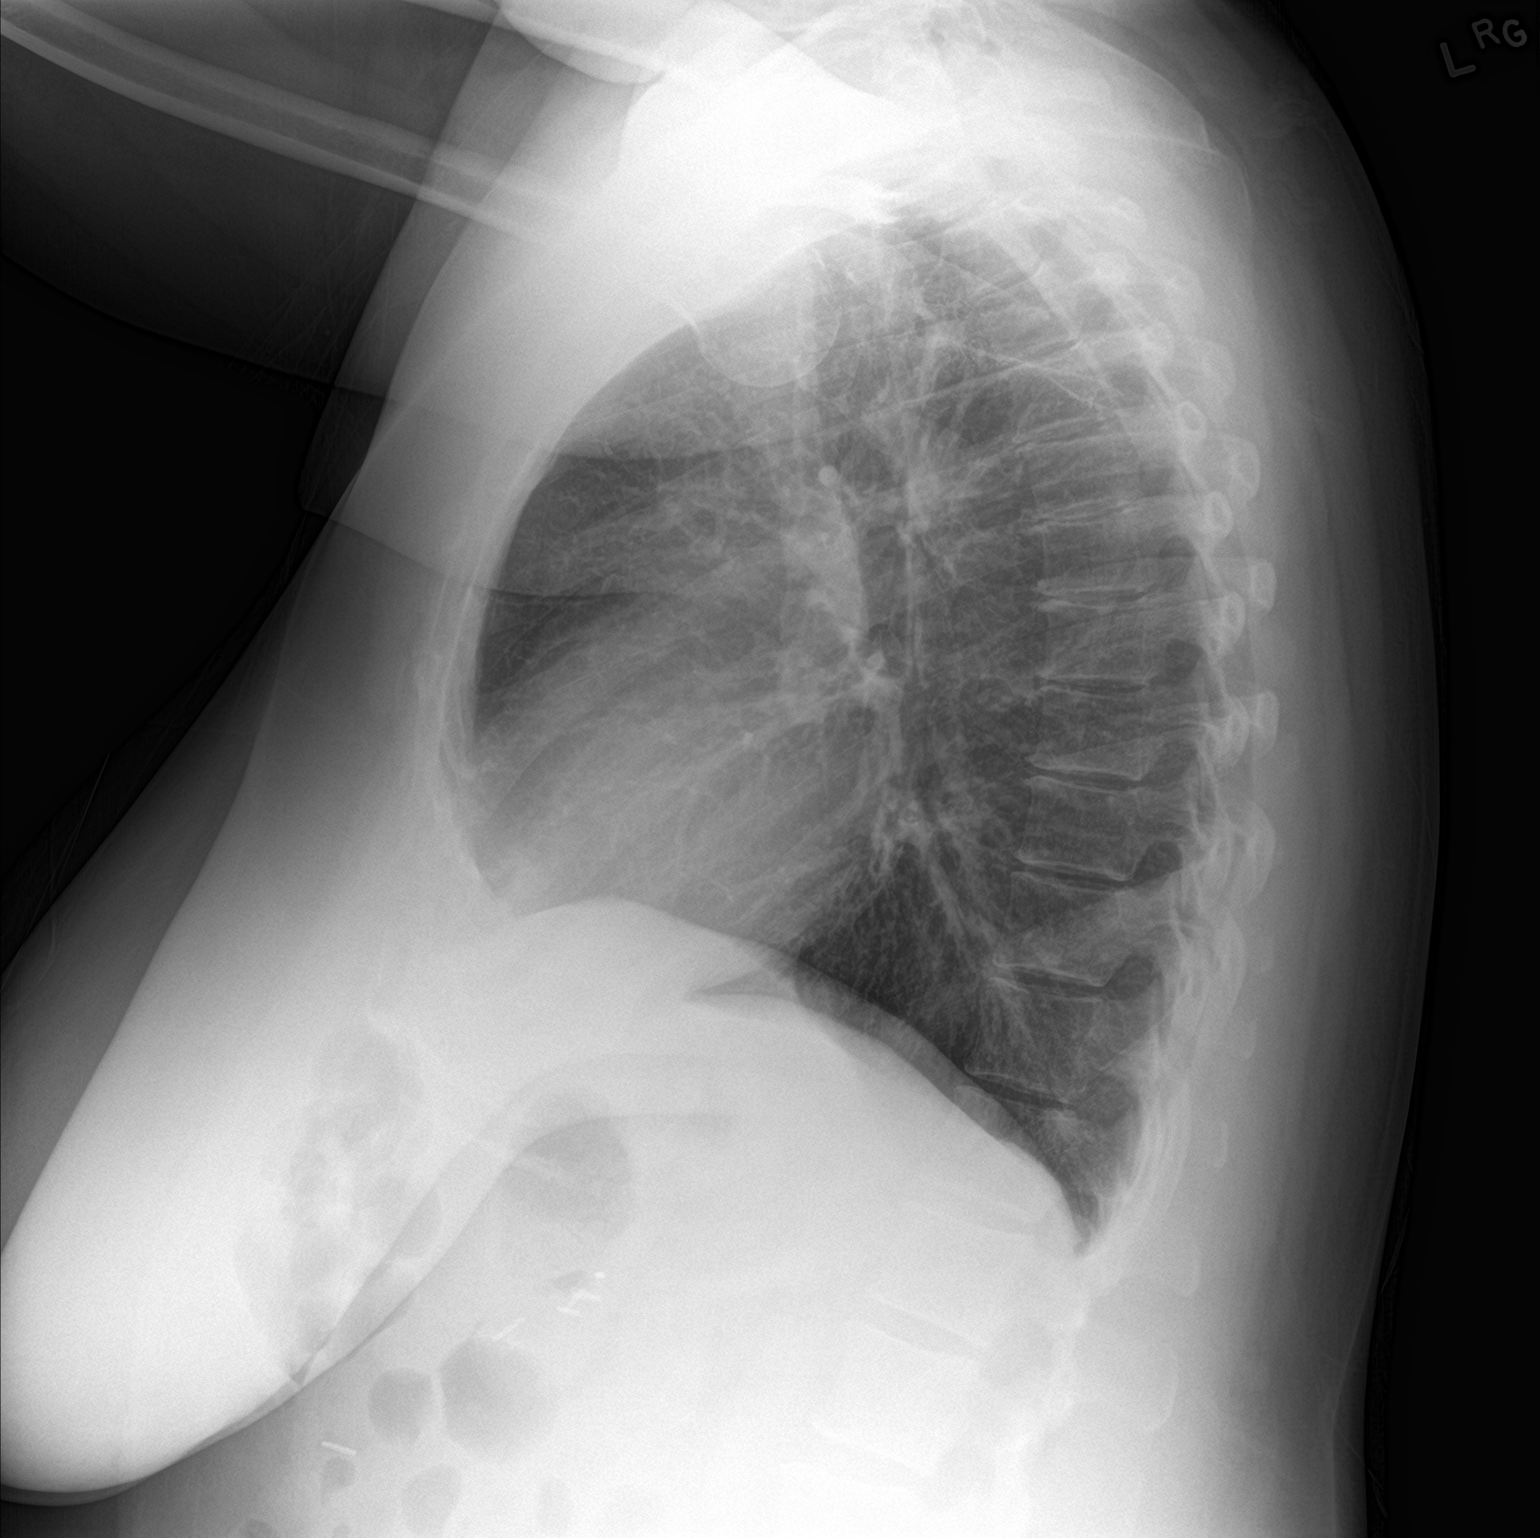

[2 of 2 positions shown; findings below may reference images not displayed]

FINDINGS: Airway thickening is present, suggesting bronchitis or reactive
airways disease. Cardiac and mediastinal margins appear normal. No
discrete airspace opacity. Mild thoracic spondylosis.
IMPRESSION: Airway thickening is present, suggesting bronchitis or reactive
airways disease.
